# Patient Record
Sex: Male | Born: 1980 | Race: White | Hispanic: No | Marital: Married | Smoking: Never smoker
Health system: Southern US, Community
[De-identification: ages and names within clinical notes are randomized; demographics above are authoritative.]

## PROBLEM LIST (undated history)

## (undated) DIAGNOSIS — G4730 Sleep apnea, unspecified: Secondary | ICD-10-CM

## (undated) DIAGNOSIS — E7800 Pure hypercholesterolemia, unspecified: Secondary | ICD-10-CM

## (undated) DIAGNOSIS — K429 Umbilical hernia without obstruction or gangrene: Secondary | ICD-10-CM

## (undated) DIAGNOSIS — K760 Fatty (change of) liver, not elsewhere classified: Secondary | ICD-10-CM

## (undated) HISTORY — PX: COLONOSCOPY: SHX174

## (undated) HISTORY — DX: Sleep apnea, unspecified: G47.30

## (undated) HISTORY — DX: Pure hypercholesterolemia, unspecified: E78.00

## (undated) HISTORY — PX: EYE SURGERY: SHX253

## (undated) HISTORY — PX: NO PAST SURGERIES: SHX2092

## (undated) HISTORY — PX: UPPER GASTROINTESTINAL ENDOSCOPY: SHX188

## (undated) HISTORY — DX: Umbilical hernia without obstruction or gangrene: K42.9

## (undated) HISTORY — PX: WISDOM TOOTH EXTRACTION: SHX21

## (undated) HISTORY — DX: Fatty (change of) liver, not elsewhere classified: K76.0

---

## 1998-03-20 ENCOUNTER — Emergency Department (HOSPITAL_COMMUNITY): Admission: EM | Admit: 1998-03-20 | Discharge: 1998-03-20 | Payer: Self-pay

## 2009-03-30 ENCOUNTER — Emergency Department: Payer: Self-pay | Admitting: Emergency Medicine

## 2014-02-25 ENCOUNTER — Ambulatory Visit (INDEPENDENT_AMBULATORY_CARE_PROVIDER_SITE_OTHER): Payer: BC Managed Care – PPO | Admitting: Pulmonary Disease

## 2014-02-25 ENCOUNTER — Encounter: Payer: Self-pay | Admitting: Pulmonary Disease

## 2014-02-25 VITALS — BP 122/82 | HR 71 | Temp 98.1°F | Ht 70.5 in | Wt 278.2 lb

## 2014-02-25 DIAGNOSIS — G4733 Obstructive sleep apnea (adult) (pediatric): Secondary | ICD-10-CM

## 2014-02-25 NOTE — Patient Instructions (Signed)
Will schedule for home sleep testing.  Will call once results are available Work on weight loss 

## 2014-02-25 NOTE — Progress Notes (Signed)
   Subjective:    Patient ID: Antonio Villanueva, male    DOB: 05-Mar-1981, 33 y.o.   MRN: [id number]  HPI The patient is a 33 year old male who comes in today for evaluation of possible sleep apnea. He is been noted by his wife to have loud snoring, as well as witnessed apneas and gasping episodes. He has frequent awakenings at night, and is not rested in the mornings upon arising. He has definite sleep pressure during the day with inactivity, and will fall asleep easily in the evenings watching television or movies. He also has mild sleep pressure driving shorter or longer distances. The patient states that his weight is up 20 pounds over the last 2 years, and his Epworth score today is 11.   Sleep Questionnaire What time do you typically go to bed?( Between what hours) 9-11pm 9-11pm at 1607 on 02/25/14 by Darrell Jewel, CMA How long does it take you to fall asleep? 10 minutes 10 minutes at 1607 on 02/25/14 by Darrell Jewel, CMA How many times during the night do you wake up? 3 3 at 1607 on 02/25/14 by Darrell Jewel, CMA What time do you get out of bed to start your day? 0430 0430 at 1607 on 02/25/14 by Darrell Jewel, CMA Do you drive or operate heavy machinery in your occupation? No No at 1607 on 02/25/14 by Darrell Jewel, CMA How much has your weight changed (up or down) over the past two years? (In pounds) 20 lb (9.072 kg) 20 lb (9.072 kg) at 1607 on 02/25/14 by Darrell Jewel, CMA Have you ever had a sleep study before? No No at 1607 on 02/25/14 by Darrell Jewel, CMA Do you currently use CPAP? No No at 1607 on 02/25/14 by Darrell Jewel, CMA Do you wear oxygen at any time? No No at 1607 on 02/25/14 by Darrell Jewel, CMA   Review of Systems  Constitutional: Negative for fever and unexpected weight change.  HENT: Negative for congestion, dental problem, ear pain, nosebleeds, postnasal drip, rhinorrhea, sinus pressure, sneezing,  sore throat and trouble swallowing.   Eyes: Negative for redness and itching.  Respiratory: Positive for shortness of breath. Negative for cough, chest tightness and wheezing.   Cardiovascular: Positive for leg swelling. Negative for palpitations.  Gastrointestinal: Negative for nausea and vomiting.  Genitourinary: Negative for dysuria.  Musculoskeletal: Negative for joint swelling.  Skin: Negative for rash.  Neurological: Positive for headaches.  Hematological: Does not bruise/bleed easily.  Psychiatric/Behavioral: Negative for dysphoric mood. The patient is not nervous/anxious.        Objective:   Physical Exam Constitutional:  Obese male, no acute distress  HENT:  Nares patent without discharge  Oropharynx without exudate, palate and uvula are elongated  Eyes:  Perrla, eomi, no scleral icterus  Neck:  No JVD, no TMG  Cardiovascular:  Normal rate, regular rhythm, no rubs or gallops.  No murmurs        Intact distal pulses  Pulmonary :  Normal breath sounds, no stridor or respiratory distress   No rales, rhonchi, or wheezing  Abdominal:  Soft, nondistended, bowel sounds present.  No tenderness noted.   Musculoskeletal:  No lower extremity edema noted.  Lymph Nodes:  No cervical lymphadenopathy noted  Skin:  No cyanosis noted  Neurologic:  Alert, appropriate, moves all 4 extremities without obvious deficit.         Assessment & Plan:

## 2014-02-25 NOTE — Assessment & Plan Note (Signed)
The patient's history is very suspicious for clinically significant sleep apnea. I have reviewed the pathophysiology of sleep disordered breathing with him, including its impact to his quality of life and cardiovascular health. He will need a sleep study for diagnosis, and he is an excellent candidate for home sleep testing. The patient is agreeable to this approach.

## 2014-04-25 DIAGNOSIS — G4730 Sleep apnea, unspecified: Secondary | ICD-10-CM

## 2014-04-25 DIAGNOSIS — G4710 Hypersomnia, unspecified: Secondary | ICD-10-CM

## 2014-05-06 ENCOUNTER — Telehealth: Payer: Self-pay | Admitting: Pulmonary Disease

## 2014-05-06 DIAGNOSIS — G4730 Sleep apnea, unspecified: Secondary | ICD-10-CM

## 2014-05-06 DIAGNOSIS — G4710 Hypersomnia, unspecified: Secondary | ICD-10-CM

## 2014-05-06 NOTE — Telephone Encounter (Signed)
[email or web address].Araina Butrick, CMA  

## 2014-05-06 NOTE — Telephone Encounter (Signed)
Pt needs ov to review sleep study 

## 2014-05-09 ENCOUNTER — Encounter: Payer: Self-pay | Admitting: Pulmonary Disease

## 2014-05-09 NOTE — Telephone Encounter (Signed)
LMTCBx2. Jennifer Castillo, CMA  

## 2014-05-11 NOTE — Telephone Encounter (Signed)
appt set for 06-13-14. Carron CurieJennifer Lawerance Matsuo, CMA

## 2014-06-13 ENCOUNTER — Ambulatory Visit (INDEPENDENT_AMBULATORY_CARE_PROVIDER_SITE_OTHER): Payer: BC Managed Care – PPO | Admitting: Pulmonary Disease

## 2014-06-13 ENCOUNTER — Encounter: Payer: Self-pay | Admitting: Pulmonary Disease

## 2014-06-13 VITALS — BP 132/78 | HR 60 | Temp 97.7°F | Ht 70.5 in | Wt 280.4 lb

## 2014-06-13 DIAGNOSIS — G4733 Obstructive sleep apnea (adult) (pediatric): Secondary | ICD-10-CM

## 2014-06-13 NOTE — Assessment & Plan Note (Signed)
The patient has mild obstructive sleep apnea by his recent home sleep test, and I have reviewed a more conservative path with a trial of weight loss alone versus a more aggressive path with either CPAP or a dental appliance. This degree of sleep apnea is not a significant cardiovascular risk for him, and I would be okay if he chose a more conservative path. He is leaning in this direction, and will discuss further with his wife.

## 2014-06-13 NOTE — Progress Notes (Signed)
   Subjective:    Patient ID: Antonio Villanueva, male    DOB: 30-Aug-1981, 33 y.o.   MRN: [id number]  HPI Patient comes in today for followup of his recent home sleep test. He was found to have mild OSA, with an AHI of 9 events per hour. I have reviewed the results with him in detail, and answered all of his questions.   Review of Systems  Constitutional: Negative for fever and unexpected weight change.  HENT: Negative for congestion, dental problem, ear pain, nosebleeds, postnasal drip, rhinorrhea, sinus pressure, sneezing, sore throat and trouble swallowing.   Eyes: Negative for redness and itching.  Respiratory: Negative for cough, chest tightness, shortness of breath and wheezing.   Cardiovascular: Negative for palpitations and leg swelling.  Gastrointestinal: Negative for nausea and vomiting.  Genitourinary: Negative for dysuria.  Musculoskeletal: Negative for joint swelling.  Skin: Negative for rash.  Neurological: Negative for headaches.  Hematological: Does not bruise/bleed easily.  Psychiatric/Behavioral: Negative for dysphoric mood. The patient is not nervous/anxious.        Objective:   Physical Exam Overweight male in no acute distress Nose without purulence or discharge noted Neck without lymphadenopathy or thyromegaly Lower extremities without edema, no cyanosis Alert and oriented, moves all 4 extremities.       Assessment & Plan:

## 2014-06-13 NOTE — Patient Instructions (Signed)
Work on weight loss, but call if you decide to treat more aggressively with either cpap or dental appliance.

## 2016-05-21 ENCOUNTER — Encounter (HOSPITAL_COMMUNITY): Payer: Self-pay | Admitting: Emergency Medicine

## 2016-05-21 ENCOUNTER — Ambulatory Visit (HOSPITAL_COMMUNITY)
Admission: EM | Admit: 2016-05-21 | Discharge: 2016-05-21 | Disposition: A | Discharge status: Home / Self Care | Payer: BLUE CROSS/BLUE SHIELD | Attending: Emergency Medicine | Admitting: Emergency Medicine

## 2016-05-21 DIAGNOSIS — H6692 Otitis media, unspecified, left ear: Secondary | ICD-10-CM

## 2016-05-21 DIAGNOSIS — J302 Other seasonal allergic rhinitis: Secondary | ICD-10-CM | POA: Diagnosis not present

## 2016-05-21 DIAGNOSIS — H6122 Impacted cerumen, left ear: Secondary | ICD-10-CM | POA: Diagnosis not present

## 2016-05-21 MED ORDER — FEXOFENADINE HCL 60 MG PO TABS: 60.0000 mg | ORAL_TABLET | Freq: Two times a day (BID) | ORAL | 0 refills | Status: DC

## 2016-05-21 MED ORDER — AMOXICILLIN 500 MG PO CAPS: 1000.0000 mg | ORAL_CAPSULE | Freq: Two times a day (BID) | ORAL | 0 refills | Status: DC

## 2016-05-21 MED ORDER — FLUTICASONE PROPIONATE 50 MCG/ACT NA SUSP: 2.0000 | Freq: Every day | NASAL | 2 refills | Status: DC

## 2016-05-21 MED ORDER — PHENYLEPHRINE HCL 10 MG PO TABS: 10.0000 mg | ORAL_TABLET | ORAL | 0 refills | Status: DC | PRN

## 2016-05-21 NOTE — ED Provider Notes (Signed)
CSN: [id number]     Arrival date & time 05/21/16  1013 History   First MD Initiated Contact with Patient 05/21/16 1104     Chief Complaint  Patient presents with  . Cough  . Otalgia  . Nasal Congestion   (Consider location/radiation/quality/duration/timing/severity/associated sxs/prior Treatment) 35 year old man complaining of left ear feeling clogged up with minor discomfort, upper rest point congestion, cough and PND. States he has to keep the head of his that elevated. Several of his family members  have similar symptoms. No fevers. he has taken dextromethorphan for cough.  MA reports cerumen impaction and irrigated L ear with return of cerumen til clear, prior to visualization by provider.      Past Medical History:  Diagnosis Date  . High cholesterol    Past Surgical History:  Procedure Laterality Date  . NO PAST SURGERIES     Family History  Problem Relation Age of Onset  . Heart attack Father    Social History  Substance Use Topics  . Smoking status: Never Smoker  . Smokeless tobacco: Never Used  . Alcohol use No    Review of Systems  Constitutional: Positive for activity change. Negative for diaphoresis, fatigue and fever.  HENT: Positive for congestion, ear pain and postnasal drip. Negative for dental problem, ear discharge, facial swelling, rhinorrhea, sore throat and trouble swallowing.   Eyes: Negative for pain, discharge and redness.  Respiratory: Positive for cough. Negative for chest tightness and shortness of breath.   Cardiovascular: Negative.   Gastrointestinal: Negative.   Musculoskeletal: Negative.  Negative for neck pain and neck stiffness.  Skin: Negative for rash.  Neurological: Negative.   All other systems reviewed and are negative.   Allergies  Review of patient's allergies indicates no known allergies.  Home Medications   Prior to Admission medications   Medication Sig Start Date End Date Taking? Authorizing Provider  amoxicillin  (AMOXIL) 500 MG capsule Take 2 capsules (1,000 mg total) by mouth 2 (two) times daily. 05/21/16   Hayden Rasmussenavid Anevay Campanella, NP  fexofenadine (ALLEGRA) 60 MG tablet Take 1 tablet (60 mg total) by mouth 2 (two) times daily. 05/21/16   Hayden Rasmussenavid Ash Mcelwain, NP  fluticasone (FLONASE) 50 MCG/ACT nasal spray Place 2 sprays into both nostrils daily. 05/21/16   Hayden Rasmussenavid Katalea Ucci, NP  ibuprofen (ADVIL,MOTRIN) 400 MG tablet Take 400 mg by mouth every 6 (six) hours as needed.    Historical Provider, MD  phenylephrine (SUDAFED PE CONGESTION) 10 MG TABS tablet Take 1 tablet (10 mg total) by mouth every 4 (four) hours as needed. 05/21/16   Hayden Rasmussenavid Lyfe Reihl, NP   Meds Ordered and Administered this Visit  Medications - No data to display  BP 142/88 (BP Location: Left Arm)   Pulse 72   Temp 98.9 F (37.2 C) (Oral)   Resp 16   SpO2 99%  No data found.   Physical Exam  Constitutional: He is oriented to person, place, and time. He appears well-developed and well-nourished. No distress.  HENT:  Head: Normocephalic and atraumatic.  Nose: Nose normal.  Mouth/Throat: No oropharyngeal exudate.  Right TM mildly retracted.  Left TM with bulging, erythema and dependent effusion.  Oropharynx with minor erythema and clear PND.  Eyes: EOM are normal.  Neck: Normal range of motion. Neck supple.  Cardiovascular: Normal rate, regular rhythm and normal heart sounds.   Pulmonary/Chest: Effort normal and breath sounds normal. No respiratory distress. He has no wheezes.  Musculoskeletal: Normal range of motion. He exhibits no edema.  Lymphadenopathy:  He has no cervical adenopathy.  Neurological: He is alert and oriented to person, place, and time.  Skin: Skin is warm and dry. No rash noted.  Psychiatric: He has a normal mood and affect.  Nursing note and vitals reviewed.   Urgent Care Course   Clinical Course    Procedures (including critical care time)  Labs Review Labs Reviewed - No data to display  Imaging Review No results  found.   Visual Acuity Review  Right Eye Distance:   Left Eye Distance:   Bilateral Distance:    Right Eye Near:   Left Eye Near:    Bilateral Near:         MDM   1. Other seasonal allergic rhinitis   2. Acute left otitis media, recurrence not specified, unspecified otitis media type   3. Cerumen impaction, left    Drink plenty of fluids Ibuprofen 600 mg for pain or discomfort Dextromethorphan for cough as needed Meds ordered this encounter  Medications  . amoxicillin (AMOXIL) 500 MG capsule    Sig: Take 2 capsules (1,000 mg total) by mouth 2 (two) times daily.    Dispense:  40 capsule    Refill:  0    Order Specific Question:   Supervising Provider    Answer:   Linna HoffKINDL, JAMES D [phone][[id number]]  . fexofenadine (ALLEGRA) 60 MG tablet    Sig: Take 1 tablet (60 mg total) by mouth 2 (two) times daily.    Dispense:  30 tablet    Refill:  0    Order Specific Question:   Supervising Provider    Answer:   Linna HoffKINDL, JAMES D [phone][[id number]]  . phenylephrine (SUDAFED PE CONGESTION) 10 MG TABS tablet    Sig: Take 1 tablet (10 mg total) by mouth every 4 (four) hours as needed.    Dispense:  30 tablet    Refill:  0    Order Specific Question:   Supervising Provider    Answer:   Linna HoffKINDL, JAMES D [phone][[id number]]  . fluticasone (FLONASE) 50 MCG/ACT nasal spray    Sig: Place 2 sprays into both nostrils daily.    Dispense:  16 g    Refill:  2    Order Specific Question:   Supervising Provider    Answer:   Linna HoffKINDL, JAMES D [[id number]]       Hayden Rasmussenavid Hayato Guaman, NP 05/21/16 1132    Hayden Rasmussenavid Tenzin Pavon, NP 05/21/16 1134

## 2016-05-21 NOTE — ED Triage Notes (Signed)
Pt has been suffering from sinus and nasal congestion with associated cough and left ear pain for 6 days.  Pt denies any fever.

## 2016-05-21 NOTE — Discharge Instructions (Signed)
Drink plenty of fluids Ibuprofen 600 mg for pain or discomfort Dextromethorphan for cough as needed

## 2016-05-30 DIAGNOSIS — Z713 Dietary counseling and surveillance: Secondary | ICD-10-CM | POA: Diagnosis not present

## 2016-05-30 DIAGNOSIS — J3089 Other allergic rhinitis: Secondary | ICD-10-CM | POA: Diagnosis not present

## 2017-01-07 DIAGNOSIS — R05 Cough: Secondary | ICD-10-CM | POA: Diagnosis not present

## 2017-01-07 DIAGNOSIS — J209 Acute bronchitis, unspecified: Secondary | ICD-10-CM | POA: Diagnosis not present

## 2017-02-10 DIAGNOSIS — Z0000 Encounter for general adult medical examination without abnormal findings: Secondary | ICD-10-CM | POA: Diagnosis not present

## 2018-09-15 DIAGNOSIS — R002 Palpitations: Secondary | ICD-10-CM | POA: Diagnosis not present

## 2018-11-20 DIAGNOSIS — M79672 Pain in left foot: Secondary | ICD-10-CM | POA: Diagnosis not present

## 2018-11-27 DIAGNOSIS — M722 Plantar fascial fibromatosis: Secondary | ICD-10-CM | POA: Diagnosis not present

## 2018-11-27 DIAGNOSIS — M25572 Pain in left ankle and joints of left foot: Secondary | ICD-10-CM | POA: Diagnosis not present

## 2019-02-11 DIAGNOSIS — K429 Umbilical hernia without obstruction or gangrene: Secondary | ICD-10-CM | POA: Diagnosis not present

## 2019-08-19 DIAGNOSIS — Z6836 Body mass index (BMI) 36.0-36.9, adult: Secondary | ICD-10-CM | POA: Diagnosis not present

## 2019-08-19 DIAGNOSIS — Z13220 Encounter for screening for lipoid disorders: Secondary | ICD-10-CM | POA: Diagnosis not present

## 2019-08-19 DIAGNOSIS — Z23 Encounter for immunization: Secondary | ICD-10-CM | POA: Diagnosis not present

## 2019-08-19 DIAGNOSIS — Z0000 Encounter for general adult medical examination without abnormal findings: Secondary | ICD-10-CM | POA: Diagnosis not present

## 2019-08-19 DIAGNOSIS — Z713 Dietary counseling and surveillance: Secondary | ICD-10-CM | POA: Diagnosis not present

## 2019-12-24 ENCOUNTER — Ambulatory Visit: Payer: Self-pay | Attending: Internal Medicine

## 2019-12-24 DIAGNOSIS — Z23 Encounter for immunization: Secondary | ICD-10-CM

## 2019-12-24 NOTE — Progress Notes (Signed)
   Covid-19 Vaccination Clinic  Name:  DEMICHAEL TRAUM    MRN: [id number] DOB: [birth year]  12/24/2019  Mr. Powell was observed post Covid-19 immunization for 15 minutes without incident. He was provided with Vaccine Information Sheet and instruction to access the V-Safe system.   Mr. Siegel was instructed to call 911 with any severe reactions post vaccine: Marland Kitchen Difficulty breathing  . Swelling of face and throat  . A fast heartbeat  . A bad rash all over body  . Dizziness and weakness   Immunizations Administered    Name Date Dose VIS Date Route   Pfizer COVID-19 Vaccine 12/24/2019  9:08 AM 0.3 mL 09/10/2019 Intramuscular   Manufacturer: [employer], [employer]   Lot: [id number]   NDC: [address]-3

## 2020-01-17 ENCOUNTER — Ambulatory Visit: Payer: Self-pay | Attending: Internal Medicine

## 2020-01-17 DIAGNOSIS — Z23 Encounter for immunization: Secondary | ICD-10-CM

## 2020-01-17 NOTE — Progress Notes (Signed)
   Covid-19 Vaccination Clinic  Name:  JOSIE MESA    MRN: [id number] DOB: 1981/03/02  01/17/2020  Mr. Difrancesco was observed post Covid-19 immunization for 15 minutes without incident. He was provided with Vaccine Information Sheet and instruction to access the V-Safe system.   Mr. Sipos was instructed to call 911 with any severe reactions post vaccine: Marland Kitchen Difficulty breathing  . Swelling of face and throat  . A fast heartbeat  . A bad rash all over body  . Dizziness and weakness   Immunizations Administered    Name Date Dose VIS Date Route   Pfizer COVID-19 Vaccine 01/17/2020  2:29 PM 0.3 mL 11/24/2018 Intramuscular   Manufacturer: [employer], [employer]   Lot: [id number]   NDC: [address]-8

## 2020-03-30 ENCOUNTER — Ambulatory Visit
Admission: EM | Admit: 2020-03-30 | Discharge: 2020-03-30 | Disposition: A | Discharge status: Home / Self Care | Payer: BLUE CROSS/BLUE SHIELD | Attending: Physician Assistant | Admitting: Physician Assistant

## 2020-03-30 ENCOUNTER — Other Ambulatory Visit: Payer: Self-pay

## 2020-03-30 ENCOUNTER — Encounter: Payer: Self-pay | Admitting: Emergency Medicine

## 2020-03-30 DIAGNOSIS — R059 Cough, unspecified: Secondary | ICD-10-CM

## 2020-03-30 DIAGNOSIS — R0982 Postnasal drip: Secondary | ICD-10-CM | POA: Diagnosis not present

## 2020-03-30 NOTE — Discharge Instructions (Signed)
COVID PCR testing ordered. I would like you to quarantine until testing results. You can take over the counter flonase/nasacort to help with nasal congestion/drainage. Tylenol/motrin for pain and fever. Keep hydrated, urine should be clear to pale yellow in color. If experiencing shortness of breath, trouble breathing, go to the emergency department for further evaluation needed.  

## 2020-03-30 NOTE — ED Triage Notes (Signed)
Onset 4-5 days ago.  Drainage in throat, cough, unsure of fever-99?, drainage initially clear, now discolored.  Initially throat sore, but no more.

## 2020-03-30 NOTE — ED Provider Notes (Signed)
[facility]    CSN: [id number] Arrival date & time: 03/30/20  0806      History   Chief Complaint Chief Complaint  Patient presents with   Sore Throat    HPI Antonio Villanueva is a 39 y.o. male.   39 year old male comes in for 4-5 day of URI symptoms. Post nasal drip, cough, sore throat, loss of taste/smell. Tmax 99. Denies fever, chills, body aches. Denies abdominal pain, nausea, vomiting, diarrhea. Denies shortness of breath. Never smoker. Fully COVID vaccinated.      Past Medical History:  Diagnosis Date   High cholesterol     Patient Active Problem List   Diagnosis Date Noted   OSA (obstructive sleep apnea) 02/25/2014    Past Surgical History:  Procedure Laterality Date   NO PAST SURGERIES         Home Medications    Prior to Admission medications   Medication Sig Start Date End Date Taking? Authorizing Provider  ibuprofen (ADVIL,MOTRIN) 400 MG tablet Take 400 mg by mouth every 6 (six) hours as needed.    [provider]  fexofenadine (ALLEGRA) 60 MG tablet Take 1 tablet (60 mg total) by mouth 2 (two) times daily. 05/21/16 03/30/20  Hayden Rasmussen, NP  fluticasone (FLONASE) 50 MCG/ACT nasal spray Place 2 sprays into both nostrils daily. 05/21/16 03/30/20  Hayden Rasmussen, NP    Family History Family History  Problem Relation Age of Onset   Heart attack Father     Social History Social History   Tobacco Use   Smoking status: Never Smoker   Smokeless tobacco: Never Used  Substance Use Topics   Alcohol use: No   Drug use: No     Allergies   Patient has no known allergies.   Review of Systems Review of Systems  Reason unable to perform ROS: See HPI as above.     Physical Exam Triage Vital Signs ED Triage Vitals  Enc Vitals Group     BP 03/30/20 0815 134/88     Pulse Rate 03/30/20 0815 78     Resp 03/30/20 0815 18     Temp 03/30/20 0815 98.6 F (37 C)     Temp Source 03/30/20 0815 Oral     SpO2 03/30/20 0815 97 %       Weight --      Height --      Head Circumference --      Peak Flow --      Pain Score 03/30/20 0812 0     Pain Loc --      Pain Edu? --      Excl. in GC? --    No data found.  Updated Vital Signs BP 134/88 (BP Location: Left Arm) Comment (BP Location): large cuff   Pulse 78    Temp 98.6 F (37 C) (Oral)    Resp 18    SpO2 97%   Physical Exam Constitutional:      General: He is not in acute distress.    Appearance: He is well-developed. He is not ill-appearing, toxic-appearing or diaphoretic.  HENT:     Head: Normocephalic and atraumatic.     Right Ear: Tympanic membrane, ear canal and external ear normal. Tympanic membrane is not erythematous or bulging.     Ears:     Comments: Cerumen impaction, TM not visible to the left ear.     Nose:     Right Sinus: Maxillary sinus tenderness present. No frontal  sinus tenderness.     Left Sinus: Maxillary sinus tenderness present. No frontal sinus tenderness.     Mouth/Throat:     Mouth: Mucous membranes are moist.     Pharynx: Oropharynx is clear. Uvula midline.  Eyes:     Conjunctiva/sclera: Conjunctivae normal.     Pupils: Pupils are equal, round, and reactive to light.  Cardiovascular:     Rate and Rhythm: Normal rate and regular rhythm.  Pulmonary:     Effort: Pulmonary effort is normal. No accessory muscle usage, prolonged expiration, respiratory distress or retractions.     Breath sounds: No decreased air movement or transmitted upper airway sounds. No decreased breath sounds.     Comments: LCTAB Musculoskeletal:     Cervical back: Normal range of motion and neck supple.  Skin:    General: Skin is warm and dry.  Neurological:     Mental Status: He is alert and oriented to person, place, and time.      UC Treatments / Results  Labs (all labs ordered are listed, but only abnormal results are displayed) Labs Reviewed  NOVEL CORONAVIRUS, NAA    EKG   Radiology No results found.  Procedures Procedures  (including critical care time)  Medications Ordered in UC Medications - No data to display  Initial Impression / Assessment and Plan / UC Course  I have reviewed the triage vital signs and the nursing notes.  Pertinent labs & imaging results that were available during my care of the patient were reviewed by me and considered in my medical decision making (see chart for details).    COVID PCR test ordered. Patient to quarantine until testing results return. No alarming signs on exam.  Patient speaking in full sentences without respiratory distress.  Symptomatic treatment discussed.  Push fluids.  Return precautions given.  Patient expresses understanding and agrees to plan.  Final Clinical Impressions(s) / UC Diagnoses   Final diagnoses:  Post-nasal drip  Cough   ED Prescriptions    None     PDMP not reviewed this encounter.   Belinda Fisher, PA-C 03/30/20 [phone]

## 2020-03-31 LAB — SARS-COV-2, NAA 2 DAY TAT

## 2020-03-31 LAB — NOVEL CORONAVIRUS, NAA: SARS-CoV-2, NAA: NOT DETECTED

## 2021-08-21 DIAGNOSIS — Z0000 Encounter for general adult medical examination without abnormal findings: Secondary | ICD-10-CM | POA: Diagnosis not present

## 2021-08-21 DIAGNOSIS — R1011 Right upper quadrant pain: Secondary | ICD-10-CM | POA: Diagnosis not present

## 2021-08-21 DIAGNOSIS — E782 Mixed hyperlipidemia: Secondary | ICD-10-CM | POA: Diagnosis not present

## 2021-08-21 DIAGNOSIS — E669 Obesity, unspecified: Secondary | ICD-10-CM | POA: Diagnosis not present

## 2021-08-21 DIAGNOSIS — Z6837 Body mass index (BMI) 37.0-37.9, adult: Secondary | ICD-10-CM | POA: Diagnosis not present

## 2021-08-21 DIAGNOSIS — Z23 Encounter for immunization: Secondary | ICD-10-CM | POA: Diagnosis not present

## 2021-08-22 ENCOUNTER — Other Ambulatory Visit: Payer: Self-pay | Admitting: Internal Medicine

## 2021-08-22 DIAGNOSIS — R1011 Right upper quadrant pain: Secondary | ICD-10-CM

## 2021-09-04 ENCOUNTER — Ambulatory Visit
Admission: RE | Admit: 2021-09-04 | Discharge: 2021-09-04 | Disposition: A | Discharge status: Home / Self Care | Payer: BC Managed Care – PPO | Source: Ambulatory Visit | Attending: Internal Medicine | Admitting: Internal Medicine

## 2021-09-04 DIAGNOSIS — R1011 Right upper quadrant pain: Secondary | ICD-10-CM

## 2022-08-24 IMAGING — US US ABDOMEN LIMITED
1 series · 14 of 25 positions shown · non-contrast
Comparison: None.

CLINICAL DATA: Right upper quadrant abdominal pain.

EXAM:
ULTRASOUND ABDOMEN LIMITED RIGHT UPPER QUADRANT

[Series 1: us abdomen limited · 0.25mm/px · 14 of 45 slices shown]
[im 1/45]
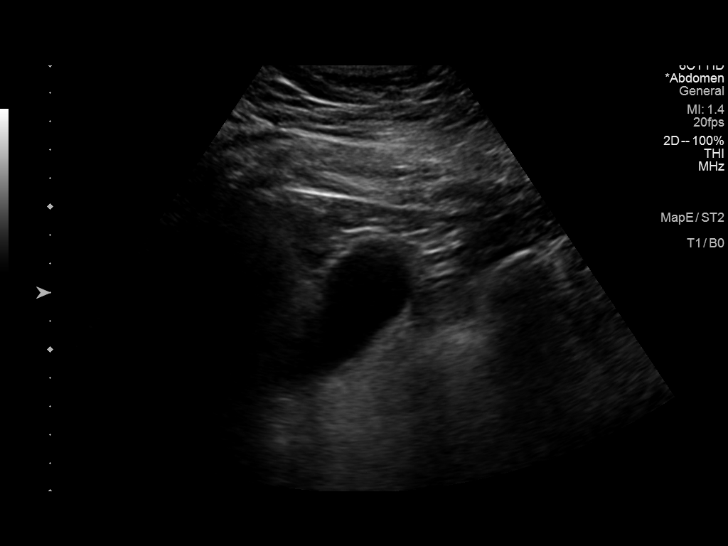
[im 4/45]
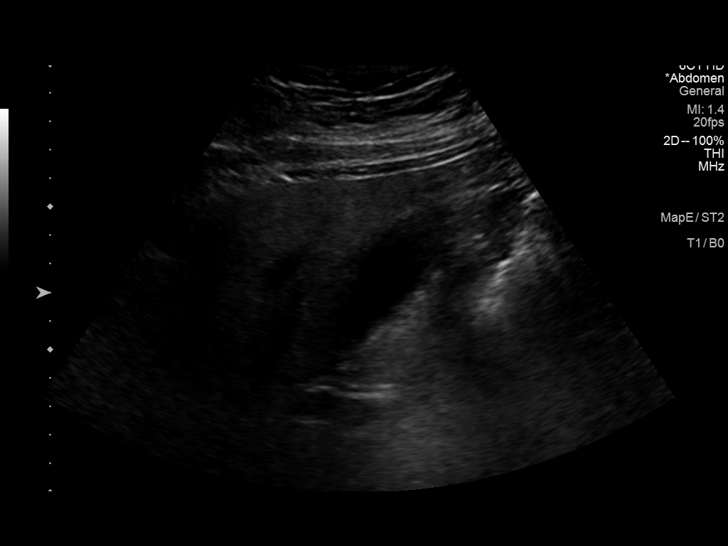
[im 8/45]
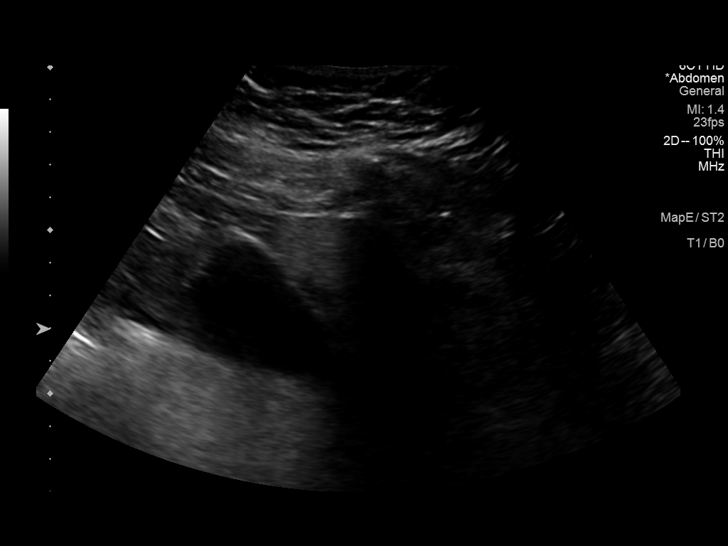
[im 12/45]
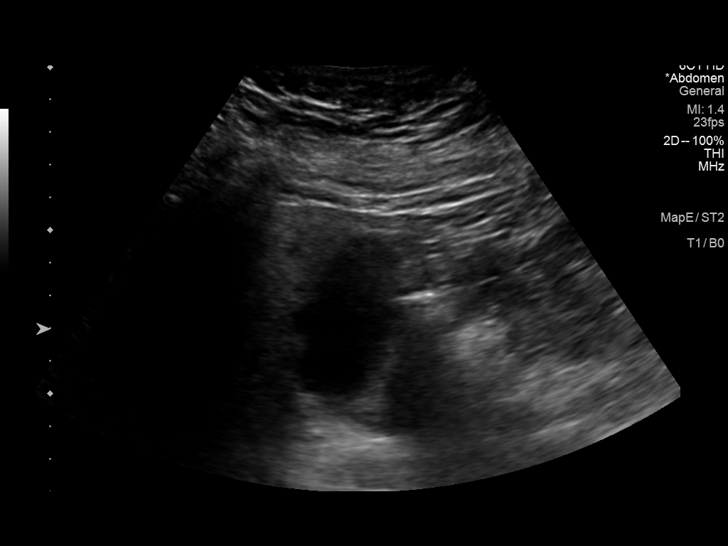
[im 15/45]
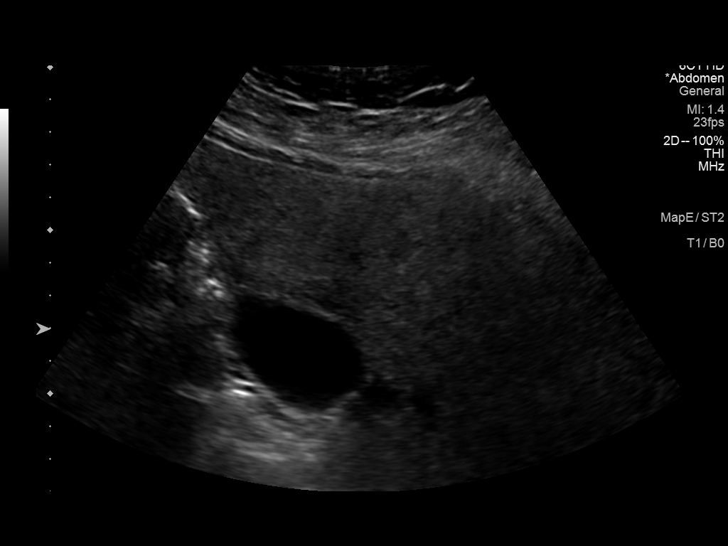
[im 17/45]
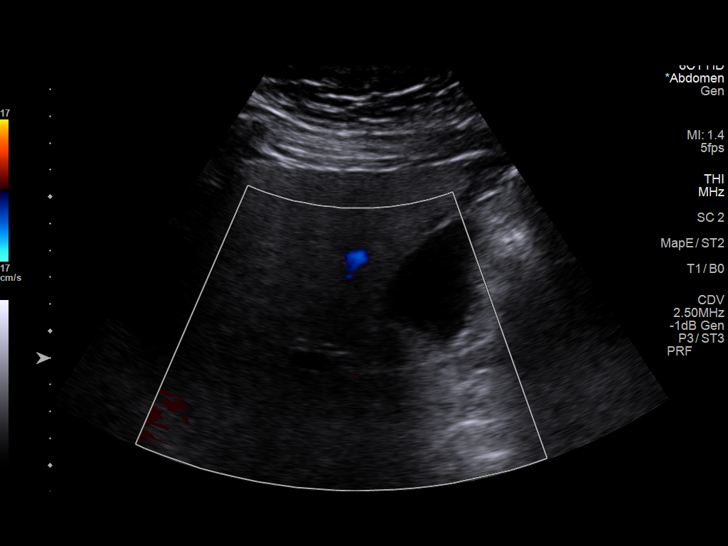
[im 21/45]
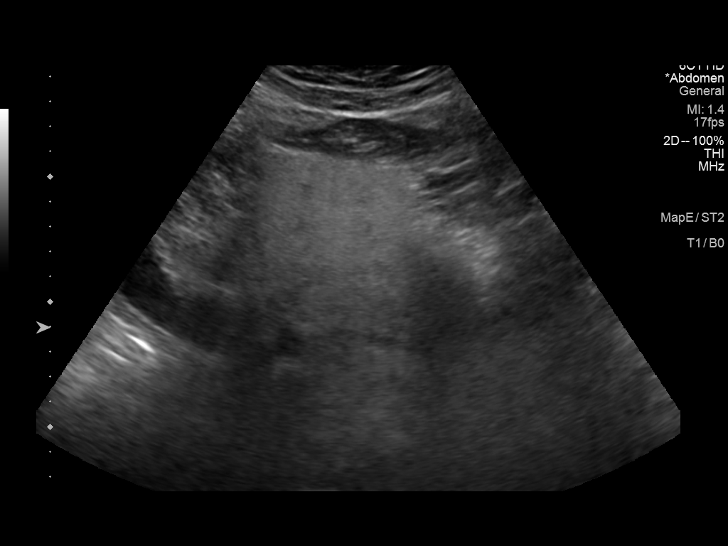
[im 24/45]
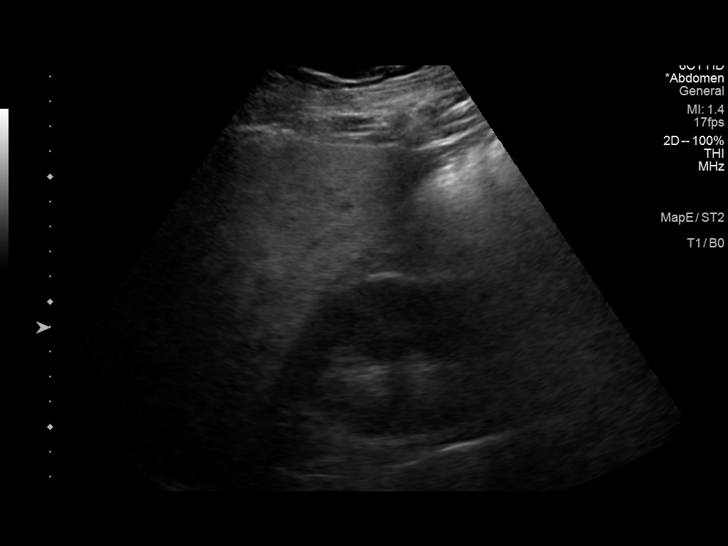
[im 28/45]
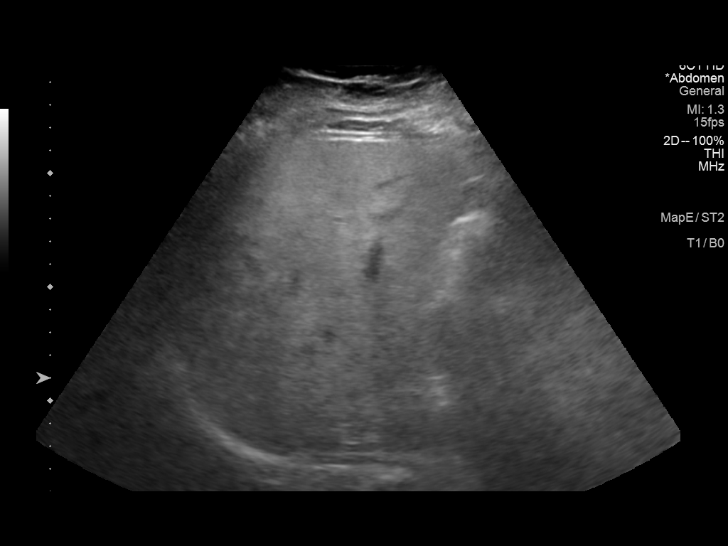
[im 30/45]
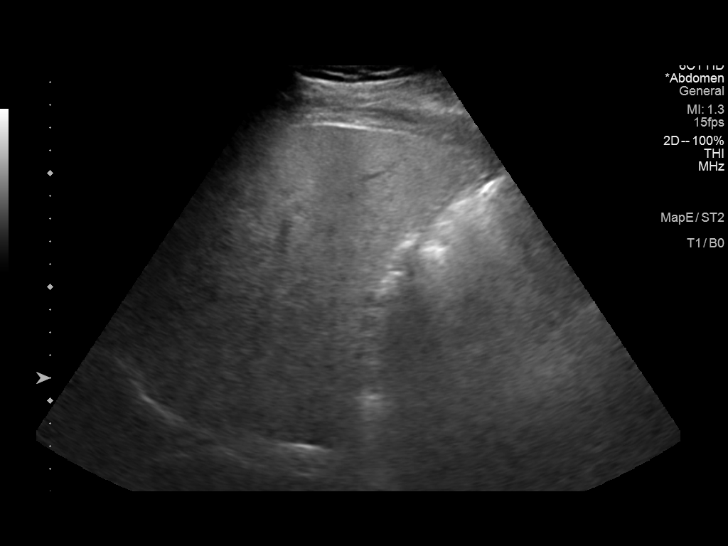
[im 34/45]
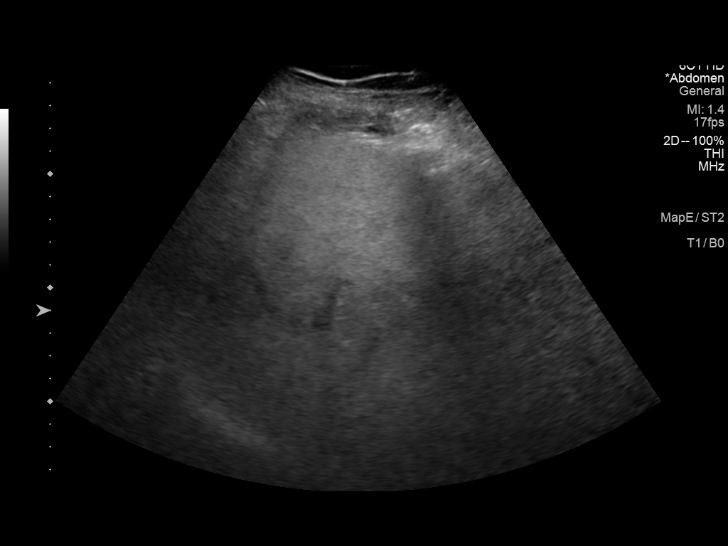
[im 37/45]
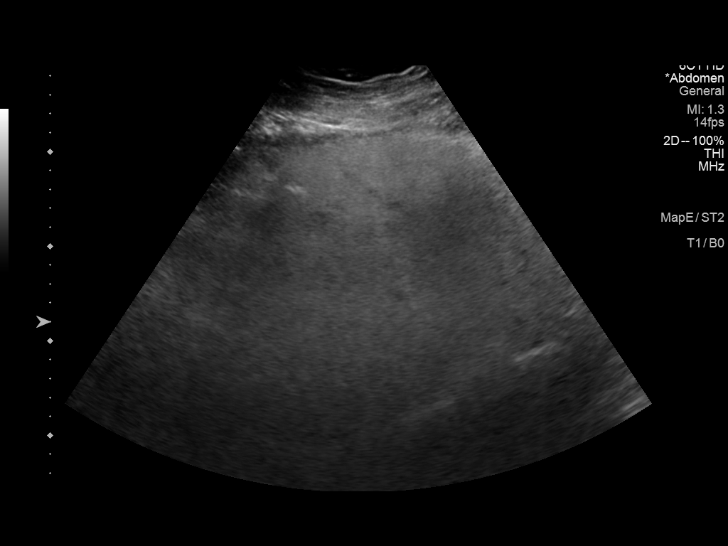
[im 41/45]
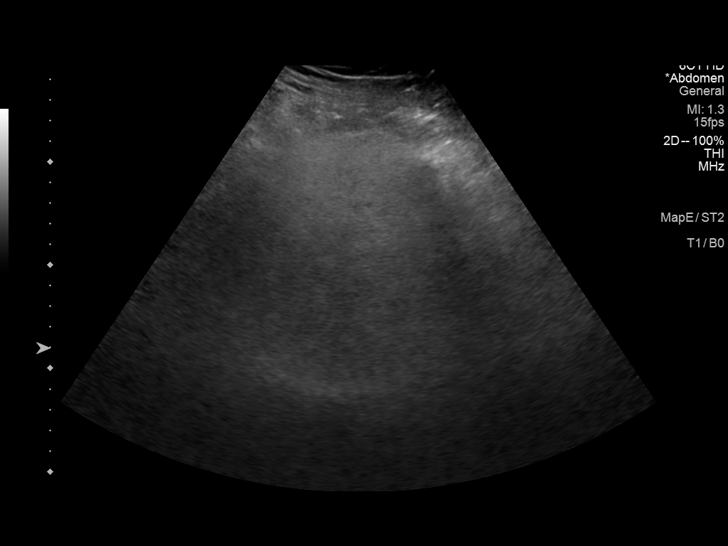
[im 45/45]
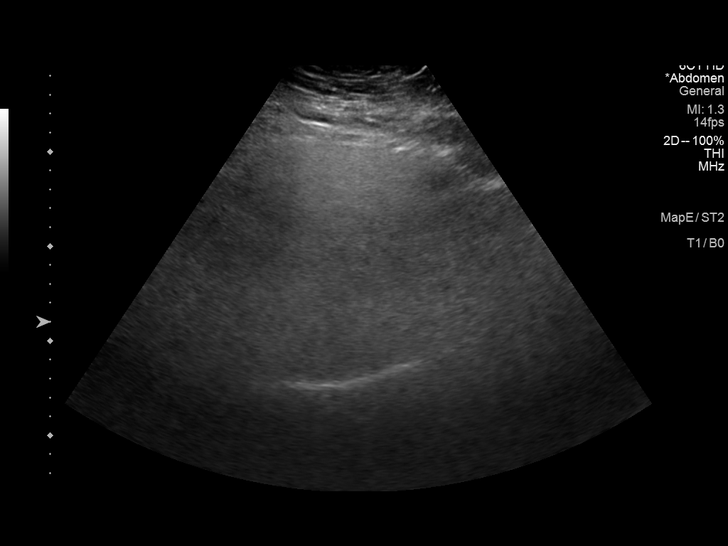

[14 of 25 positions shown; findings below may reference images not displayed]

FINDINGS: Gallbladder:

No gallstones or wall thickening visualized. No sonographic Murphy
sign noted by sonographer.

Common bile duct:

Diameter: 4 mm

Liver:

No focal lesion identified. Increase in parenchymal echogenicity.
Portal vein is patent on color Doppler imaging with normal direction
of blood flow towards the liver.

Other: None.
IMPRESSION: 1. Echogenic liver likely related to diffuse hepatocellular disease
such as fatty infiltration.
2. Normal appearance of the gallbladder.

## 2022-09-12 ENCOUNTER — Other Ambulatory Visit: Payer: Self-pay | Admitting: Internal Medicine

## 2022-09-12 DIAGNOSIS — Z23 Encounter for immunization: Secondary | ICD-10-CM | POA: Diagnosis not present

## 2022-09-12 DIAGNOSIS — Z8249 Family history of ischemic heart disease and other diseases of the circulatory system: Secondary | ICD-10-CM | POA: Diagnosis not present

## 2022-09-12 DIAGNOSIS — E669 Obesity, unspecified: Secondary | ICD-10-CM | POA: Diagnosis not present

## 2022-09-12 DIAGNOSIS — K429 Umbilical hernia without obstruction or gangrene: Secondary | ICD-10-CM | POA: Diagnosis not present

## 2022-09-12 DIAGNOSIS — E782 Mixed hyperlipidemia: Secondary | ICD-10-CM | POA: Diagnosis not present

## 2022-10-15 ENCOUNTER — Other Ambulatory Visit: Payer: BC Managed Care – PPO

## 2022-11-06 ENCOUNTER — Other Ambulatory Visit: Payer: BC Managed Care – PPO

## 2023-01-30 DIAGNOSIS — Z5181 Encounter for therapeutic drug level monitoring: Secondary | ICD-10-CM | POA: Diagnosis not present

## 2023-01-30 DIAGNOSIS — E782 Mixed hyperlipidemia: Secondary | ICD-10-CM | POA: Diagnosis not present

## 2023-09-22 ENCOUNTER — Ambulatory Visit
Admission: EM | Admit: 2023-09-22 | Discharge: 2023-09-22 | Disposition: A | Discharge status: Home / Self Care | Payer: BC Managed Care – PPO | Attending: Family Medicine | Admitting: Family Medicine

## 2023-09-22 DIAGNOSIS — R070 Pain in throat: Secondary | ICD-10-CM | POA: Diagnosis not present

## 2023-09-22 DIAGNOSIS — J069 Acute upper respiratory infection, unspecified: Secondary | ICD-10-CM | POA: Insufficient documentation

## 2023-09-22 DIAGNOSIS — R110 Nausea: Secondary | ICD-10-CM | POA: Insufficient documentation

## 2023-09-22 LAB — POCT RAPID STREP A (OFFICE): Rapid Strep A Screen: NEGATIVE

## 2023-09-22 LAB — POCT INFLUENZA A/B
Influenza A, POC: NEGATIVE
Influenza B, POC: NEGATIVE

## 2023-09-22 MED ORDER — KETOROLAC TROMETHAMINE 30 MG/ML IJ SOLN
30.0000 mg | Freq: Once | INTRAMUSCULAR | Status: AC
  Administered 2023-09-22: 30 mg via INTRAMUSCULAR

## 2023-09-22 MED ORDER — ONDANSETRON 4 MG PO TBDP
4.0000 mg | ORAL_TABLET | Freq: Three times a day (TID) | ORAL | 0 refills | Status: DC | PRN
Start: 2023-09-22 — End: 2024-02-17

## 2023-09-22 MED ORDER — BENZONATATE 100 MG PO CAPS: 100.0000 mg | ORAL_CAPSULE | Freq: Three times a day (TID) | ORAL | 0 refills | Status: DC | PRN

## 2023-09-22 MED ORDER — ONDANSETRON 4 MG PO TBDP
4.0000 mg | ORAL_TABLET | Freq: Once | ORAL | Status: AC
  Administered 2023-09-22: 4 mg via ORAL

## 2023-09-22 NOTE — ED Provider Notes (Signed)
[facility]    CSN: [id number] Arrival date & time: 09/22/23  1153      History   Chief Complaint No chief complaint on file.   HPI Antonio Villanueva is a 42 y.o. male.   HPI Here for sore throat, cough and sinus pain. Symptoms began on the evening of 12/20. Uncertain if any fever. Has had a lot of myalgia and malaise. Today began having nausea and has had one loose stool. No vomiting so far.  NKDA   Past Medical History:  Diagnosis Date   High cholesterol     Patient Active Problem List   Diagnosis Date Noted   OSA (obstructive sleep apnea) 02/25/2014    Past Surgical History:  Procedure Laterality Date   NO PAST SURGERIES         Home Medications    Prior to Admission medications   Medication Sig Start Date End Date Taking? Authorizing Provider  benzonatate (TESSALON) 100 MG capsule Take 1 capsule (100 mg total) by mouth 3 (three) times daily as needed for cough. 09/22/23  Yes Zenia Resides, MD  ondansetron (ZOFRAN-ODT) 4 MG disintegrating tablet Take 1 tablet (4 mg total) by mouth every 8 (eight) hours as needed for nausea or vomiting. 09/22/23  Yes Zenia Resides, MD  ibuprofen (ADVIL,MOTRIN) 400 MG tablet Take 400 mg by mouth every 6 (six) hours as needed.    [provider]  fexofenadine (ALLEGRA) 60 MG tablet Take 1 tablet (60 mg total) by mouth 2 (two) times daily. 05/21/16 03/30/20  Hayden Rasmussen, NP  fluticasone (FLONASE) 50 MCG/ACT nasal spray Place 2 sprays into both nostrils daily. 05/21/16 03/30/20  Hayden Rasmussen, NP    Family History Family History  Problem Relation Age of Onset   Heart attack Father     Social History Social History   Tobacco Use   Smoking status: Never   Smokeless tobacco: Never  Substance Use Topics   Alcohol use: No   Drug use: No     Allergies   Patient has no known allergies.   Review of Systems Review of Systems   Physical Exam Triage Vital Signs ED Triage Vitals  Encounter  Vitals Group     BP 09/22/23 1329 (!) 149/90     Systolic BP Percentile --      Diastolic BP Percentile --      Pulse Rate 09/22/23 1329 89     Resp 09/22/23 1329 18     Temp 09/22/23 1329 98 F (36.7 C)     Temp Source 09/22/23 1329 Oral     SpO2 09/22/23 1329 97 %     Weight 09/22/23 1326 282 lb (127.9 kg)     Height 09/22/23 1326 5\' 11"  (1.803 m)     Head Circumference --      Peak Flow --      Pain Score 09/22/23 1326 7     Pain Loc --      Pain Education --      Exclude from Growth Chart --    No data found.  Updated Vital Signs BP (!) 149/90 (BP Location: Left Arm)   Pulse 89   Temp 98 F (36.7 C) (Oral)   Resp 18   Ht 5\' 11"  (1.803 m)   Wt 127.9 kg   SpO2 97%   BMI 39.33 kg/m   Visual Acuity Right Eye Distance:   Left Eye Distance:   Bilateral Distance:    Right Eye Near:  Left Eye Near:    Bilateral Near:     Physical Exam Vitals reviewed.  Constitutional:      General: He is not in acute distress.    Appearance: He is not toxic-appearing.  HENT:     Right Ear: Tympanic membrane and ear canal normal.     Left Ear: Tympanic membrane and ear canal normal.     Nose: Congestion present.     Mouth/Throat:     Mouth: Mucous membranes are moist.     Comments: There is some erythema of the throat and clear exudate draining Eyes:     Extraocular Movements: Extraocular movements intact.     Conjunctiva/sclera: Conjunctivae normal.     Pupils: Pupils are equal, round, and reactive to light.  Cardiovascular:     Rate and Rhythm: Normal rate and regular rhythm.     Heart sounds: No murmur heard. Pulmonary:     Effort: Pulmonary effort is normal. No respiratory distress.     Breath sounds: Normal breath sounds. No stridor. No wheezing, rhonchi or rales.  Abdominal:     Palpations: Abdomen is soft.     Tenderness: There is no abdominal tenderness.  Musculoskeletal:     Cervical back: Neck supple.  Lymphadenopathy:     Cervical: No cervical adenopathy.   Skin:    Capillary Refill: Capillary refill takes less than 2 seconds.     Coloration: Skin is not jaundiced or pale.  Neurological:     General: No focal deficit present.     Mental Status: He is alert and oriented to person, place, and time.  Psychiatric:        Behavior: Behavior normal.      UC Treatments / Results  Labs (all labs ordered are listed, but only abnormal results are displayed) Labs Reviewed  POCT RAPID STREP A (OFFICE) - Normal  CULTURE, GROUP A STREP ([facility])  SARS CORONAVIRUS 2 (TAT 6-24 HRS)  POCT INFLUENZA A/B    EKG   Radiology No results found.  Procedures Procedures (including critical care time)  Medications Ordered in UC Medications  ketorolac (TORADOL) 30 MG/ML injection 30 mg (has no administration in time range)  ondansetron (ZOFRAN-ODT) disintegrating tablet 4 mg (4 mg Oral Given 09/22/23 1352)    Initial Impression / Assessment and Plan / UC Course  I have reviewed the triage vital signs and the nursing notes.  Pertinent labs & imaging results that were available during my care of the patient were reviewed by me and considered in my medical decision making (see chart for details).     Your strep test is negative.  Culture of the throat will be sent, and staff will notify you if that is in turn positive.  Flu test is negative.  COVID swab is done and we will notify if positive, so they know they need to isolate.  Tessalon perles are sent in for cough, and zofran sent for nausea, as well as one dose given here for nausea.  Toradol injection given for pain Final Clinical Impressions(s) / UC Diagnoses   Final diagnoses:  Viral URI with cough  Throat pain  Nausea     Discharge Instructions      Your strep test is negative.  Culture of the throat will be sent, and staff will notify you if that is in turn positive.  Flu test was also negative.  Ondansetron dissolved in the mouth every 8 hours as needed for nausea or  vomiting. Clear liquids(water, gatorade/pedialyte,  ginger ale/sprite, chicken broth/soup) and bland things(crackers/toast, rice, potato, bananas) to eat. Avoid acidic foods like lemon/lime/orange/tomato, and avoid greasy/spicy foods. (We gave you one dose of this medication today)   You have been swabbed for COVID, and the test will result in the next 24 hours. Our staff will call you if positive. If the COVID test is positive, you should quarantine until you are fever free for 24 hours and you are starting to feel better, and then take added precautions for the next 5 days, such as physical distancing/wearing a mask and good hand hygiene/washing.      ED Prescriptions     Medication Sig Dispense Auth. Provider   benzonatate (TESSALON) 100 MG capsule Take 1 capsule (100 mg total) by mouth 3 (three) times daily as needed for cough. 21 capsule Zenia Resides, MD   ondansetron (ZOFRAN-ODT) 4 MG disintegrating tablet Take 1 tablet (4 mg total) by mouth every 8 (eight) hours as needed for nausea or vomiting. 10 tablet Marlinda Mike Janace Aris, MD      PDMP not reviewed this encounter.   Zenia Resides, MD 09/22/23 [phone]

## 2023-09-22 NOTE — Discharge Instructions (Addendum)
Your strep test is negative.  Culture of the throat will be sent, and staff will notify you if that is in turn positive.  Flu test was also negative.  Ondansetron dissolved in the mouth every 8 hours as needed for nausea or vomiting. Clear liquids(water, gatorade/pedialyte, ginger ale/sprite, chicken broth/soup) and bland things(crackers/toast, rice, potato, bananas) to eat. Avoid acidic foods like lemon/lime/orange/tomato, and avoid greasy/spicy foods. (We gave you one dose of this medication today)   You have been swabbed for COVID, and the test will result in the next 24 hours. Our staff will call you if positive. If the COVID test is positive, you should quarantine until you are fever free for 24 hours and you are starting to feel better, and then take added precautions for the next 5 days, such as physical distancing/wearing a mask and good hand hygiene/washing.

## 2023-09-22 NOTE — ED Triage Notes (Signed)
Patient presents with sinus pain, ear pain ,sore throat, joint pain, diarrhea and nausea. Symptoms started on Friday night. Treated with Theraflu.

## 2023-09-23 LAB — SARS CORONAVIRUS 2 (TAT 6-24 HRS): SARS Coronavirus 2: POSITIVE — AB

## 2023-09-25 LAB — CULTURE, GROUP A STREP (THRC)

## 2023-12-23 DIAGNOSIS — Z79899 Other long term (current) drug therapy: Secondary | ICD-10-CM | POA: Diagnosis not present

## 2023-12-23 DIAGNOSIS — E559 Vitamin D deficiency, unspecified: Secondary | ICD-10-CM | POA: Diagnosis not present

## 2023-12-23 DIAGNOSIS — R030 Elevated blood-pressure reading, without diagnosis of hypertension: Secondary | ICD-10-CM | POA: Diagnosis not present

## 2023-12-23 DIAGNOSIS — E782 Mixed hyperlipidemia: Secondary | ICD-10-CM | POA: Diagnosis not present

## 2023-12-23 DIAGNOSIS — R739 Hyperglycemia, unspecified: Secondary | ICD-10-CM | POA: Diagnosis not present

## 2023-12-23 DIAGNOSIS — R748 Abnormal levels of other serum enzymes: Secondary | ICD-10-CM | POA: Diagnosis not present

## 2023-12-23 DIAGNOSIS — Z0000 Encounter for general adult medical examination without abnormal findings: Secondary | ICD-10-CM | POA: Diagnosis not present

## 2024-01-13 ENCOUNTER — Encounter: Payer: Self-pay | Admitting: Gastroenterology

## 2024-02-04 DIAGNOSIS — E669 Obesity, unspecified: Secondary | ICD-10-CM | POA: Insufficient documentation

## 2024-02-04 DIAGNOSIS — K760 Fatty (change of) liver, not elsewhere classified: Secondary | ICD-10-CM | POA: Insufficient documentation

## 2024-02-04 DIAGNOSIS — E782 Mixed hyperlipidemia: Secondary | ICD-10-CM | POA: Insufficient documentation

## 2024-02-04 DIAGNOSIS — R931 Abnormal findings on diagnostic imaging of heart and coronary circulation: Secondary | ICD-10-CM | POA: Insufficient documentation

## 2024-02-04 DIAGNOSIS — J301 Allergic rhinitis due to pollen: Secondary | ICD-10-CM | POA: Insufficient documentation

## 2024-02-04 DIAGNOSIS — Z8249 Family history of ischemic heart disease and other diseases of the circulatory system: Secondary | ICD-10-CM | POA: Insufficient documentation

## 2024-02-04 DIAGNOSIS — Z6837 Body mass index (BMI) 37.0-37.9, adult: Secondary | ICD-10-CM | POA: Insufficient documentation

## 2024-02-04 DIAGNOSIS — I2510 Atherosclerotic heart disease of native coronary artery without angina pectoris: Secondary | ICD-10-CM | POA: Insufficient documentation

## 2024-02-17 ENCOUNTER — Encounter: Payer: Self-pay | Admitting: Gastroenterology

## 2024-02-17 ENCOUNTER — Ambulatory Visit (AMBULATORY_SURGERY_CENTER): Admitting: *Deleted

## 2024-02-17 VITALS — Ht 71.0 in | Wt 280.0 lb

## 2024-02-17 DIAGNOSIS — Z1211 Encounter for screening for malignant neoplasm of colon: Secondary | ICD-10-CM

## 2024-02-17 MED ORDER — NA SULFATE-K SULFATE-MG SULF 17.5-3.13-1.6 GM/177ML PO SOLN: 1.0000 | Freq: Once | ORAL | 0 refills | Status: AC

## 2024-02-17 NOTE — Progress Notes (Signed)
 Pt's name and DOB verified at the beginning of the pre-visit wit 2 identifiers  Pt denies any difficulty with ambulating,sitting, laying down or rolling side to side  Pt has no issues moving head neck or swallowing  No egg or soy allergy known to patient   No issues known to pt with past sedation with any surgeries or procedures  Patient denies ever being intubated  No FH of Malignant Hyperthermia  Pt is not on home 02   Pt is not on blood thinners   Pt denies issues with constipation   Pt is not on dialysis  Pt denise any abnormal heart rhythms   Pt denies any upcoming cardiac testing  Patient's chart reviewed by Rogena Class CNRA prior to pre-visit and patient appropriate for the [facility].  Pre-visit completed and red dot placed by patient's name on their procedure day (on provider's schedule).    Visit by phone  Pt states weight is 280 lb   IInstructions reviewed. Pt given  both [facility] main # and MD on call # prior to instructions.  Pt states understanding of instructions. Instructed pt to review instructions again prior to procedure and call main # given if has questions.. Pt states they will.   Instructed pt on where to find instructions on My Chart.

## 2024-03-03 ENCOUNTER — Ambulatory Visit: Admitting: Gastroenterology

## 2024-03-03 ENCOUNTER — Encounter: Payer: Self-pay | Admitting: Gastroenterology

## 2024-03-03 VITALS — BP 101/62 | HR 61 | Temp 97.4°F | Resp 11 | Ht 71.0 in | Wt 280.0 lb

## 2024-03-03 DIAGNOSIS — Z1211 Encounter for screening for malignant neoplasm of colon: Secondary | ICD-10-CM

## 2024-03-03 DIAGNOSIS — K644 Residual hemorrhoidal skin tags: Secondary | ICD-10-CM

## 2024-03-03 DIAGNOSIS — K648 Other hemorrhoids: Secondary | ICD-10-CM

## 2024-03-03 DIAGNOSIS — D125 Benign neoplasm of sigmoid colon: Secondary | ICD-10-CM

## 2024-03-03 DIAGNOSIS — K635 Polyp of colon: Secondary | ICD-10-CM | POA: Diagnosis not present

## 2024-03-03 MED ORDER — SODIUM CHLORIDE 0.9 % IV SOLN: 500.0000 mL | Freq: Once | INTRAVENOUS | Status: DC

## 2024-03-03 NOTE — Op Note (Signed)
 [facility] Patient Name: Real Cona Procedure Date: 03/03/2024 11:57 AM MRN: [id number] Endoscopist: Ace Abu L. Dominic Friendly , MD, [id number] Age: 43 Referring MD:  Date of Birth: 03-26-1981 Gender: Male Account #: [id number] Procedure:                Colonoscopy Indications:              Screening for colorectal malignant neoplasm, This                            is the patient's first colonoscopy Medicines:                Monitored Anesthesia Care Procedure:                Pre-Anesthesia Assessment:                           - Prior to the procedure, a History and Physical                            was performed, and patient medications and                            allergies were reviewed. The patient's tolerance of                            previous anesthesia was also reviewed. The risks                            and benefits of the procedure and the sedation                            options and risks were discussed with the patient.                            All questions were answered, and informed consent                            was obtained. Prior Anticoagulants: The patient has                            taken no anticoagulant or antiplatelet agents. ASA                            Grade Assessment: II - A patient with mild systemic                            disease. After reviewing the risks and benefits,                            the patient was deemed in satisfactory condition to                            undergo the procedure.  After obtaining informed consent, the colonoscope                            was passed under direct vision. Throughout the                            procedure, the patient's blood pressure, pulse, and                            oxygen saturations were monitored continuously. The                            CF [id number] #[id number] was introduced through the anus                            and advanced to the the  cecum, identified by                            appendiceal orifice and ileocecal valve. The                            colonoscopy was performed without difficulty. The                            patient tolerated the procedure well. The quality                            of the bowel preparation was excellent. The                            ileocecal valve, appendiceal orifice, and rectum                            were photographed. Scope In: 12:09:11 PM Scope Out: 12:21:55 PM Scope Withdrawal Time: 0 hours 10 minutes 50 seconds  Total Procedure Duration: 0 hours 12 minutes 44 seconds  Findings:                 External hemorrhoids were found on perianal exam.                           Repeat examination of right colon under NBI                            performed.                           An 8 mm polyp was found in the distal sigmoid                            colon. The polyp was pedunculated. The polyp was                            removed with a hot snare. Resection and retrieval  were complete.                           Internal hemorrhoids were found.                           The exam was otherwise without abnormality on                            direct and retroflexion views. Complications:            No immediate complications. Estimated Blood Loss:     Estimated blood loss: none. Impression:               - Hemorrhoids found on perianal exam.                           - One 8 mm polyp in the distal sigmoid colon,                            removed with a hot snare. Resected and retrieved.                           - Internal hemorrhoids.                           - The examination was otherwise normal on direct                            and retroflexion views. Recommendation:           - Patient has a contact number available for                            emergencies. The signs and symptoms of potential                            delayed  complications were discussed with the                            patient. Return to normal activities tomorrow.                            Written discharge instructions were provided to the                            patient.                           - Resume previous diet.                           - Continue present medications.                           - Await pathology results.                           -  Repeat colonoscopy is recommended for                            surveillance. The colonoscopy date will be                            determined after pathology results from today's                            exam become available for review. Zyrion Coey L. Dominic Friendly, MD 03/03/2024 12:27:28 PM This report has been signed electronically.

## 2024-03-03 NOTE — Progress Notes (Signed)
 Called to room to assist during endoscopic procedure.  Patient ID and intended procedure confirmed with present staff. Received instructions for my participation in the procedure from the performing physician.

## 2024-03-03 NOTE — Progress Notes (Signed)
 History and Physical:  This patient presents for endoscopic testing for: Encounter Diagnosis  Name Primary?   Special screening for malignant neoplasms, colon Yes    Average risk for colorectal cancer.  1st screening exam.  Patient denies chronic abdominal pain, rectal bleeding, constipation or diarrhea.   Patient is otherwise without complaints or active issues today.   Past Medical History: Past Medical History:  Diagnosis Date   Fatty liver    High cholesterol    Sleep apnea    Mild with no need for CPAP per pt   Umbilical hernia      Past Surgical History: Past Surgical History:  Procedure Laterality Date   COLONOSCOPY     EYE SURGERY Left    age 43-4   NO PAST SURGERIES     UPPER GASTROINTESTINAL ENDOSCOPY     WISDOM TOOTH EXTRACTION      Allergies: No Known Allergies  Outpatient Meds: Current Outpatient Medications  Medication Sig Dispense Refill   rosuvastatin (CRESTOR) 10 MG tablet Take 10 mg by mouth daily.     aspirin EC 81 MG tablet Take 81 mg by mouth daily. Swallow whole.     ibuprofen (ADVIL,MOTRIN) 400 MG tablet Take 400 mg by mouth every 6 (six) hours as needed. (Patient not taking: Reported on 03/03/2024)     niacin (VITAMIN B3) 500 MG ER tablet Take 500 mg by mouth at bedtime.     OVER THE COUNTER MEDICATION Glucosamine with Tumeric     OVER THE COUNTER MEDICATION Magnesium 1000mg      Vitamin D, Ergocalciferol, (DRISDOL) 1.25 MG (50000 UNIT) CAPS capsule Take 50,000 Units by mouth every 7 (seven) days.     Current Facility-Administered Medications  Medication Dose Route Frequency Provider Last Rate Last Admin   0.9 %  sodium chloride infusion  500 mL Intravenous Once Danis, Roel Clarity III, MD          ___________________________________________________________________ Objective   Exam:  BP 132/66   Pulse 70   Temp (!) 97.4 F (36.3 C)   Resp 11   Ht 5\' 11"  (1.803 m)   Wt 280 lb (127 kg)   SpO2 97%   BMI 39.05 kg/m   CV: regular ,  S1/S2 Resp: clear to auscultation bilaterally, normal RR and effort noted GI: soft, no tenderness, with active bowel sounds.   Assessment: Encounter Diagnosis  Name Primary?   Special screening for malignant neoplasms, colon Yes     Plan: Colonoscopy   The benefits and risks of the planned procedure(s) were described in detail with the patient or (when appropriate) their health care proxy.  Risks were outlined as including, but not limited to, bleeding, infection, perforation, adverse medication reaction leading to cardiac or pulmonary decompensation, pancreatitis (if ERCP).  The limitation of incomplete mucosal visualization was also discussed.  No guarantees or warranties were given.  The patient is appropriate for an endoscopic procedure in the ambulatory setting.   - Lorella Roles, MD

## 2024-03-03 NOTE — Patient Instructions (Signed)

## 2024-03-03 NOTE — Progress Notes (Signed)
 A/o x 3, VSS, gd SR's, pleased with anesthesia, report to RN

## 2024-03-03 NOTE — Progress Notes (Signed)
 Pt's states no medical or surgical changes since previsit or office visit.

## 2024-03-04 ENCOUNTER — Telehealth: Payer: Self-pay

## 2024-03-04 NOTE — Telephone Encounter (Signed)
  Follow up Call-     03/03/2024   10:51 AM  Call back number  Post procedure Call Back phone  # [phone]  Permission to leave phone message Yes     Patient questions:  Do you have a fever, pain , or abdominal swelling? No. Pain Score  0 *  Have you tolerated food without any problems? Yes.    Have you been able to return to your normal activities? Yes.    Do you have any questions about your discharge instructions: Diet   No. Medications  No. Follow up visit  No.  Do you have questions or concerns about your Care? No.  Actions: * If pain score is 4 or above: No action needed, pain <4.

## 2024-03-05 LAB — SURGICAL PATHOLOGY

## 2024-03-17 ENCOUNTER — Ambulatory Visit: Payer: Self-pay | Admitting: Gastroenterology
